# Patient Record
Sex: Female | Born: 1950 | Race: White | Hispanic: Yes | Smoking: Never smoker
Health system: Southern US, Community
[De-identification: ages and names within clinical notes are randomized; demographics above are authoritative.]

## PROBLEM LIST (undated history)

## (undated) DIAGNOSIS — F419 Anxiety disorder, unspecified: Secondary | ICD-10-CM

## (undated) DIAGNOSIS — S069X9A Unspecified intracranial injury with loss of consciousness of unspecified duration, initial encounter: Secondary | ICD-10-CM

## (undated) DIAGNOSIS — G43909 Migraine, unspecified, not intractable, without status migrainosus: Secondary | ICD-10-CM

## (undated) DIAGNOSIS — F4310 Post-traumatic stress disorder, unspecified: Secondary | ICD-10-CM

## (undated) DIAGNOSIS — S069XAA Unspecified intracranial injury with loss of consciousness status unknown, initial encounter: Secondary | ICD-10-CM

## (undated) DIAGNOSIS — E079 Disorder of thyroid, unspecified: Secondary | ICD-10-CM

## (undated) DIAGNOSIS — I10 Essential (primary) hypertension: Secondary | ICD-10-CM

## (undated) HISTORY — PX: MANDIBLE SURGERY: SHX707

## (undated) HISTORY — PX: NASAL SINUS SURGERY: SHX719

## (undated) HISTORY — PX: CHOLECYSTECTOMY: SHX55

## (undated) HISTORY — PX: ABDOMINAL HYSTERECTOMY: SHX81

---

## 2010-11-13 ENCOUNTER — Other Ambulatory Visit (HOSPITAL_COMMUNITY): Payer: Self-pay | Admitting: Chiropractic Medicine

## 2010-11-13 DIAGNOSIS — M5104 Intervertebral disc disorders with myelopathy, thoracic region: Secondary | ICD-10-CM

## 2010-11-17 ENCOUNTER — Other Ambulatory Visit (HOSPITAL_COMMUNITY): Payer: Self-pay | Admitting: Chiropractic Medicine

## 2010-11-17 DIAGNOSIS — R52 Pain, unspecified: Secondary | ICD-10-CM

## 2010-11-18 ENCOUNTER — Other Ambulatory Visit (HOSPITAL_COMMUNITY): Payer: Self-pay

## 2010-11-20 ENCOUNTER — Ambulatory Visit (HOSPITAL_COMMUNITY)
Admission: RE | Admit: 2010-11-20 | Discharge: 2010-11-20 | Disposition: A | Discharge status: Home / Self Care | Payer: Self-pay | Source: Ambulatory Visit | Attending: Chiropractic Medicine | Admitting: Chiropractic Medicine

## 2010-11-20 DIAGNOSIS — R52 Pain, unspecified: Secondary | ICD-10-CM

## 2010-11-20 DIAGNOSIS — Q762 Congenital spondylolisthesis: Secondary | ICD-10-CM | POA: Insufficient documentation

## 2010-11-20 DIAGNOSIS — M47817 Spondylosis without myelopathy or radiculopathy, lumbosacral region: Secondary | ICD-10-CM | POA: Insufficient documentation

## 2010-11-20 DIAGNOSIS — M545 Low back pain, unspecified: Secondary | ICD-10-CM | POA: Insufficient documentation

## 2010-11-20 DIAGNOSIS — M79609 Pain in unspecified limb: Secondary | ICD-10-CM | POA: Insufficient documentation

## 2010-11-20 DIAGNOSIS — M5126 Other intervertebral disc displacement, lumbar region: Secondary | ICD-10-CM | POA: Insufficient documentation

## 2010-11-20 DIAGNOSIS — M5137 Other intervertebral disc degeneration, lumbosacral region: Secondary | ICD-10-CM | POA: Insufficient documentation

## 2010-11-20 DIAGNOSIS — M51379 Other intervertebral disc degeneration, lumbosacral region without mention of lumbar back pain or lower extremity pain: Secondary | ICD-10-CM | POA: Insufficient documentation

## 2010-11-20 DIAGNOSIS — M4120 Other idiopathic scoliosis, site unspecified: Secondary | ICD-10-CM | POA: Insufficient documentation

## 2011-11-09 ENCOUNTER — Other Ambulatory Visit: Payer: Self-pay | Admitting: Neurosurgery

## 2011-11-09 DIAGNOSIS — M5410 Radiculopathy, site unspecified: Secondary | ICD-10-CM

## 2011-11-09 DIAGNOSIS — M479 Spondylosis, unspecified: Secondary | ICD-10-CM

## 2011-11-09 DIAGNOSIS — M549 Dorsalgia, unspecified: Secondary | ICD-10-CM

## 2011-11-16 ENCOUNTER — Other Ambulatory Visit: Payer: Self-pay

## 2011-11-30 ENCOUNTER — Other Ambulatory Visit: Payer: Self-pay | Admitting: Neurosurgery

## 2011-11-30 DIAGNOSIS — M5410 Radiculopathy, site unspecified: Secondary | ICD-10-CM

## 2011-11-30 DIAGNOSIS — M479 Spondylosis, unspecified: Secondary | ICD-10-CM

## 2011-11-30 DIAGNOSIS — M549 Dorsalgia, unspecified: Secondary | ICD-10-CM

## 2011-12-07 ENCOUNTER — Other Ambulatory Visit: Payer: Self-pay

## 2011-12-09 ENCOUNTER — Ambulatory Visit
Admission: RE | Admit: 2011-12-09 | Discharge: 2011-12-09 | Disposition: A | Discharge status: Home / Self Care | Payer: Medicare Other | Source: Ambulatory Visit | Attending: Neurosurgery | Admitting: Neurosurgery

## 2011-12-09 DIAGNOSIS — M479 Spondylosis, unspecified: Secondary | ICD-10-CM

## 2011-12-09 DIAGNOSIS — M5410 Radiculopathy, site unspecified: Secondary | ICD-10-CM

## 2011-12-09 DIAGNOSIS — M549 Dorsalgia, unspecified: Secondary | ICD-10-CM

## 2011-12-09 MED ORDER — IOHEXOL 180 MG/ML  SOLN
15.0000 mL | Freq: Once | INTRAMUSCULAR | Status: AC | PRN
  Administered 2011-12-09: 15 mL via INTRATHECAL

## 2011-12-09 MED ORDER — OXYCODONE-ACETAMINOPHEN 5-325 MG PO TABS
1.0000 | ORAL_TABLET | Freq: Once | ORAL | Status: AC
  Administered 2011-12-09: 1 via ORAL

## 2011-12-09 MED ORDER — DIAZEPAM 5 MG PO TABS
10.0000 mg | ORAL_TABLET | Freq: Once | ORAL | Status: AC
  Administered 2011-12-09: 10 mg via ORAL

## 2011-12-09 NOTE — Progress Notes (Signed)
Patient states she has been off Adderall, Cymbalta, Tramadol and Trazadone for at least the past two days.  jkl

## 2012-02-23 ENCOUNTER — Emergency Department (HOSPITAL_COMMUNITY)
Admission: EM | Admit: 2012-02-23 | Discharge: 2012-02-25 | Disposition: A | Discharge status: Home / Self Care | Payer: Medicare Other | Attending: Emergency Medicine | Admitting: Emergency Medicine

## 2012-02-23 ENCOUNTER — Emergency Department (HOSPITAL_COMMUNITY): Payer: Medicare Other

## 2012-02-23 DIAGNOSIS — Z91199 Patient's noncompliance with other medical treatment and regimen due to unspecified reason: Secondary | ICD-10-CM | POA: Insufficient documentation

## 2012-02-23 DIAGNOSIS — F329 Major depressive disorder, single episode, unspecified: Secondary | ICD-10-CM | POA: Insufficient documentation

## 2012-02-23 DIAGNOSIS — S0990XA Unspecified injury of head, initial encounter: Secondary | ICD-10-CM

## 2012-02-23 DIAGNOSIS — Z79899 Other long term (current) drug therapy: Secondary | ICD-10-CM | POA: Insufficient documentation

## 2012-02-23 DIAGNOSIS — L089 Local infection of the skin and subcutaneous tissue, unspecified: Secondary | ICD-10-CM

## 2012-02-23 DIAGNOSIS — Z9119 Patient's noncompliance with other medical treatment and regimen: Secondary | ICD-10-CM | POA: Insufficient documentation

## 2012-02-23 DIAGNOSIS — IMO0002 Reserved for concepts with insufficient information to code with codable children: Secondary | ICD-10-CM | POA: Insufficient documentation

## 2012-02-23 DIAGNOSIS — S0003XA Contusion of scalp, initial encounter: Secondary | ICD-10-CM | POA: Insufficient documentation

## 2012-02-23 DIAGNOSIS — F411 Generalized anxiety disorder: Secondary | ICD-10-CM | POA: Diagnosis present

## 2012-02-23 DIAGNOSIS — F430 Acute stress reaction: Secondary | ICD-10-CM

## 2012-02-23 DIAGNOSIS — F909 Attention-deficit hyperactivity disorder, unspecified type: Secondary | ICD-10-CM | POA: Insufficient documentation

## 2012-02-23 DIAGNOSIS — F3289 Other specified depressive episodes: Secondary | ICD-10-CM | POA: Insufficient documentation

## 2012-02-23 DIAGNOSIS — Y9389 Activity, other specified: Secondary | ICD-10-CM | POA: Insufficient documentation

## 2012-02-23 DIAGNOSIS — Z792 Long term (current) use of antibiotics: Secondary | ICD-10-CM | POA: Insufficient documentation

## 2012-02-23 DIAGNOSIS — Y9289 Other specified places as the place of occurrence of the external cause: Secondary | ICD-10-CM | POA: Insufficient documentation

## 2012-02-23 DIAGNOSIS — W1809XA Striking against other object with subsequent fall, initial encounter: Secondary | ICD-10-CM | POA: Insufficient documentation

## 2012-02-23 DIAGNOSIS — F29 Unspecified psychosis not due to a substance or known physiological condition: Secondary | ICD-10-CM | POA: Insufficient documentation

## 2012-02-23 DIAGNOSIS — F339 Major depressive disorder, recurrent, unspecified: Secondary | ICD-10-CM

## 2012-02-23 LAB — CBC
HCT: 38.1 % (ref 36.0–46.0)
MCH: 31.3 pg (ref 26.0–34.0)
MCV: 88.4 fL (ref 78.0–100.0)
RBC: 4.31 MIL/uL (ref 3.87–5.11)
WBC: 10.2 10*3/uL (ref 4.0–10.5)

## 2012-02-23 LAB — URINALYSIS, ROUTINE W REFLEX MICROSCOPIC
Glucose, UA: NEGATIVE mg/dL
Hgb urine dipstick: NEGATIVE
Specific Gravity, Urine: 1.015 (ref 1.005–1.030)
Urobilinogen, UA: 0.2 mg/dL (ref 0.0–1.0)
pH: 6 (ref 5.0–8.0)

## 2012-02-23 LAB — RAPID URINE DRUG SCREEN, HOSP PERFORMED
Amphetamines: NOT DETECTED
Benzodiazepines: NOT DETECTED
Cocaine: NOT DETECTED
Opiates: NOT DETECTED

## 2012-02-23 LAB — BASIC METABOLIC PANEL
BUN: 14 mg/dL (ref 6–23)
CO2: 23 mEq/L (ref 19–32)
Chloride: 99 mEq/L (ref 96–112)
Creatinine, Ser: 0.64 mg/dL (ref 0.50–1.10)
Glucose, Bld: 88 mg/dL (ref 70–99)

## 2012-02-23 LAB — ETHANOL: Alcohol, Ethyl (B): <11 mg/dL (ref 0–11)

## 2012-02-23 MED ORDER — IBUPROFEN 600 MG PO TABS: 600.0000 mg | ORAL_TABLET | Freq: Three times a day (TID) | ORAL | Status: DC | PRN

## 2012-02-23 MED ORDER — ONDANSETRON HCL 4 MG PO TABS: 4.0000 mg | ORAL_TABLET | Freq: Three times a day (TID) | ORAL | Status: DC | PRN

## 2012-02-23 MED ORDER — NAPROXEN SODIUM 275 MG PO TABS
275.0000 mg | ORAL_TABLET | Freq: Two times a day (BID) | ORAL | Status: DC | PRN
  Filled 2012-02-23: qty 1

## 2012-02-23 MED ORDER — ALUM & MAG HYDROXIDE-SIMETH 200-200-20 MG/5ML PO SUSP: 30.0000 mL | ORAL | Status: DC | PRN

## 2012-02-23 MED ORDER — ZOLPIDEM TARTRATE 5 MG PO TABS: 5.0000 mg | ORAL_TABLET | Freq: Every evening | ORAL | Status: DC | PRN

## 2012-02-23 MED ORDER — ACETAMINOPHEN 325 MG PO TABS: 650.0000 mg | ORAL_TABLET | ORAL | Status: DC | PRN

## 2012-02-23 MED ORDER — CLONAZEPAM 0.5 MG PO TABS
0.5000 mg | ORAL_TABLET | Freq: Two times a day (BID) | ORAL | Status: DC
  Administered 2012-02-24: 1 mg via ORAL
  Administered 2012-02-24: 0.5 mg via ORAL
  Administered 2012-02-25: 1 mg via ORAL
  Filled 2012-02-23: qty 2
  Filled 2012-02-23: qty 1
  Filled 2012-02-23: qty 2

## 2012-02-23 MED ORDER — NICOTINE 21 MG/24HR TD PT24: 21.0000 mg | MEDICATED_PATCH | Freq: Every day | TRANSDERMAL | Status: DC | PRN

## 2012-02-23 MED ORDER — AMPHETAMINE-DEXTROAMPHET ER 10 MG PO CP24
20.0000 mg | ORAL_CAPSULE | Freq: Two times a day (BID) | ORAL | Status: DC
  Administered 2012-02-24 – 2012-02-25 (×2): 20 mg via ORAL
  Filled 2012-02-23: qty 2
  Filled 2012-02-23: qty 1
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
## 2012-02-23 NOTE — ED Notes (Signed)
JXB:[id number]<GN> Expected date:02/23/12<BR> Expected time: 4:34 PM<BR> Means of arrival:Ambulance<BR> Comments:<BR> Medical clearance

## 2012-02-23 NOTE — ED Notes (Signed)
ACT Team at bedside.  

## 2012-02-23 NOTE — ED Notes (Signed)
Pt has lac and hematoma to back of head on L side of head. Area cleaned with saline and gauze. Pt also has swelling, redness and bruising to L elbow. Ice pack provided to L elbow.

## 2012-02-23 NOTE — ED Notes (Signed)
Pt a/o x 3. Pt denies pain at present. States she doesn't want to be here. Pt makes good eye contact. Thoughts are well formed and organized. [employer] remains outside of pt's room.

## 2012-02-23 NOTE — ED Notes (Signed)
MD at bedside. Dr. Wentz at bedside.  

## 2012-02-23 NOTE — ED Notes (Signed)
Pt's friend came by to check on her. Friend is Margreta Journey [phone].
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
## 2012-02-24 DIAGNOSIS — F339 Major depressive disorder, recurrent, unspecified: Secondary | ICD-10-CM

## 2012-02-24 DIAGNOSIS — F430 Acute stress reaction: Secondary | ICD-10-CM

## 2012-02-24 DIAGNOSIS — F4310 Post-traumatic stress disorder, unspecified: Secondary | ICD-10-CM

## 2012-02-24 MED ORDER — DULOXETINE HCL 60 MG PO CPEP
60.0000 mg | ORAL_CAPSULE | Freq: Every day | ORAL | Status: DC
  Administered 2012-02-24 – 2012-02-25 (×2): 60 mg via ORAL
  Filled 2012-02-24 (×4): qty 1

## 2012-02-24 NOTE — ED Notes (Signed)
Cleaned pt.'s wounds, (1-left elbow, 2-back of head), removed old drsg.'s, cleaned wounds with saline, dried with sterile gauze, applied sterile gauze and taped.  No swelling or oder noted.  Scant amt. Of pale red drainage on head gauze.  Pt. Calm and cooperative.

## 2012-02-24 NOTE — Progress Notes (Addendum)
Pt referred to [facility] pending review, pt declined per act team.   Pt referred to [facility] pending review.   Pt referred to [facility] pending review.   No beds at [facility] or [facility].   Pt declined at [facility].   Catha Gosselin, LCSWA  [phone] .02/24/2012 20:43pm

## 2012-02-24 NOTE — ED Notes (Signed)
Annette Glover [phone]

## 2012-02-24 NOTE — BHH Counselor (Signed)
TC with Claris Che, intake RN at [facility]. Informed they currently do not have bed availability. TC to [place] [facility]. Stated they have adult bed availability. Faxed info over for review.
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
## 2012-02-24 NOTE — ED Notes (Signed)
Patient unable to complete telepysch. Dr. Jacky Kindle reported to call back when patient is more alert.

## 2012-02-24 NOTE — ED Provider Notes (Signed)
Filed Vitals:   02/24/12 0627  BP: 149/86  Pulse: 91  Temp: 98.5 F (36.9 C)  Resp: 18   Pt sleeping in bed. NAD. Appears to be still pending psychiatric consultation.   Raeford Razor, MD 02/24/12 [phone]

## 2012-02-25 DIAGNOSIS — F411 Generalized anxiety disorder: Secondary | ICD-10-CM | POA: Diagnosis present

## 2012-02-25 DIAGNOSIS — F339 Major depressive disorder, recurrent, unspecified: Secondary | ICD-10-CM | POA: Diagnosis present

## 2012-02-25 MED ORDER — DULOXETINE HCL 60 MG PO CPEP: 60.0000 mg | ORAL_CAPSULE | Freq: Every day | ORAL | Status: AC
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
## 2012-02-25 NOTE — ED Provider Notes (Signed)
On AM rounds the patient is awake, alert, appropriately interactive.  She states that she is comfortable, and is hungry.  Psych eval pending  Gerhard Munch, MD 02/25/12 [phone]

## 2012-02-25 NOTE — Progress Notes (Signed)
CSW completed [facility] application, received [facility] authorization [id number] for 02/25/12-03/02/12.  CSW faxed application to [facility].   CSW will f/u re: review/wait list status.    Vickii Penna, LCSWA [phone]  Clinical Social Work

## 2012-02-25 NOTE — [facility] Suicide Risk Assessment (Signed)
Suicide Risk Assessment  Discharge Assessment     Demographic Factors:  Adolescent or young adult, Caucasian and Low socioeconomic status  Mental Status Per Nursing Assessment::   On Admission:     Current Mental Status by Physician: NA  Loss Factors: Financial problems/change in socioeconomic status and family stresses  Historical Factors: NA  Risk Reduction Factors:   Sense of responsibility to family, Religious beliefs about death, Living with another person, especially a relative, Positive social support and Positive therapeutic relationship  Continued Clinical Symptoms:  Severe Anxiety and/or Agitation Depression:   Anhedonia Hopelessness Impulsivity Insomnia Recent sense of peace/wellbeing Severe Previous Psychiatric Diagnoses and Treatments Medical Diagnoses and Treatments/Surgeries  Cognitive Features That Contribute To Risk:  Closed-mindedness Loss of executive function Polarized thinking    Suicide Risk:  Minimal: No identifiable suicidal ideation.  Patients presenting with no risk factors but with morbid ruminations; may be classified as minimal risk based on the severity of the depressive symptoms  Discharge Diagnoses:   AXIS I:  Mood Disorder NOS, Post Traumatic Stress Disorder and Acute stress reaction AXIS II:  Deferred AXIS III:  No past medical history on file. AXIS IV:  other psychosocial or environmental problems, problems related to social environment and problems with primary support group AXIS V:  41-50 serious symptoms  Plan Of Care/Follow-up recommendations:  Activity:  as tolerated Diet:  regular  Is patient on multiple antipsychotic therapies at discharge:  No   Has Patient had three or more failed trials of antipsychotic monotherapy by history:  No  Recommended Plan for Multiple Antipsychotic Therapies: Not applicable  Annette Glover,Annette R. 02/25/2012, 2:54 PM

## 2012-08-10 ENCOUNTER — Emergency Department (HOSPITAL_BASED_OUTPATIENT_CLINIC_OR_DEPARTMENT_OTHER): Payer: Medicare Other

## 2012-08-10 ENCOUNTER — Emergency Department (HOSPITAL_BASED_OUTPATIENT_CLINIC_OR_DEPARTMENT_OTHER)
Admission: EM | Admit: 2012-08-10 | Discharge: 2012-08-10 | Disposition: A | Discharge status: Home / Self Care | Payer: Medicare Other | Attending: Emergency Medicine | Admitting: Emergency Medicine

## 2012-08-10 ENCOUNTER — Encounter (HOSPITAL_BASED_OUTPATIENT_CLINIC_OR_DEPARTMENT_OTHER): Payer: Self-pay | Admitting: *Deleted

## 2012-08-10 DIAGNOSIS — Z79899 Other long term (current) drug therapy: Secondary | ICD-10-CM | POA: Insufficient documentation

## 2012-08-10 DIAGNOSIS — S6990XA Unspecified injury of unspecified wrist, hand and finger(s), initial encounter: Secondary | ICD-10-CM | POA: Insufficient documentation

## 2012-08-10 DIAGNOSIS — W1809XA Striking against other object with subsequent fall, initial encounter: Secondary | ICD-10-CM | POA: Insufficient documentation

## 2012-08-10 DIAGNOSIS — Y92009 Unspecified place in unspecified non-institutional (private) residence as the place of occurrence of the external cause: Secondary | ICD-10-CM | POA: Insufficient documentation

## 2012-08-10 DIAGNOSIS — S42301A Unspecified fracture of shaft of humerus, right arm, initial encounter for closed fracture: Secondary | ICD-10-CM

## 2012-08-10 DIAGNOSIS — S42309A Unspecified fracture of shaft of humerus, unspecified arm, initial encounter for closed fracture: Secondary | ICD-10-CM | POA: Insufficient documentation

## 2012-08-10 DIAGNOSIS — I10 Essential (primary) hypertension: Secondary | ICD-10-CM | POA: Insufficient documentation

## 2012-08-10 DIAGNOSIS — S0993XA Unspecified injury of face, initial encounter: Secondary | ICD-10-CM | POA: Insufficient documentation

## 2012-08-10 DIAGNOSIS — Z880 Allergy status to penicillin: Secondary | ICD-10-CM | POA: Insufficient documentation

## 2012-08-10 DIAGNOSIS — E079 Disorder of thyroid, unspecified: Secondary | ICD-10-CM | POA: Insufficient documentation

## 2012-08-10 DIAGNOSIS — Y9389 Activity, other specified: Secondary | ICD-10-CM | POA: Insufficient documentation

## 2012-08-10 HISTORY — DX: Unspecified intracranial injury with loss of consciousness of unspecified duration, initial encounter: S06.9X9A

## 2012-08-10 HISTORY — DX: Essential (primary) hypertension: I10

## 2012-08-10 HISTORY — DX: Disorder of thyroid, unspecified: E07.9

## 2012-08-10 HISTORY — DX: Unspecified intracranial injury with loss of consciousness status unknown, initial encounter: S06.9XAA

## 2012-08-10 MED ORDER — OXYCODONE-ACETAMINOPHEN 5-325 MG PO TABS: 2.0000 | ORAL_TABLET | ORAL | Status: AC | PRN

## 2012-08-10 MED ORDER — HYDROCODONE-ACETAMINOPHEN 5-325 MG PO TABS
2.0000 | ORAL_TABLET | Freq: Once | ORAL | Status: AC
  Administered 2012-08-10: 2 via ORAL
  Filled 2012-08-10 (×2): qty 2

## 2012-08-10 NOTE — ED Notes (Signed)
Pt going by cab on voucher to friend's house for the night

## 2012-08-10 NOTE — ED Notes (Signed)
Patient transported to X-ray and returned 

## 2012-08-10 NOTE — ED Notes (Signed)
Pt attempting to reach a friend to see if she can stay with her tonight. States that it is not an option for her to stay with her daughter

## 2012-08-10 NOTE — ED Notes (Signed)
D/c on hold at this time per EDP Gwendolyn Grant

## 2012-08-10 NOTE — ED Notes (Signed)
Patient transported to X-ray 

## 2012-08-10 NOTE — ED Provider Notes (Signed)
CSN: [id number]     Arrival date & time 08/10/12  1839 History     First MD Initiated Contact with Patient 08/10/12 1856     Chief Complaint  Patient presents with  . Fall   (Consider location/radiation/quality/duration/timing/severity/associated sxs/prior Treatment) Patient is a 62 y.o. female presenting with fall. The history is provided by the patient. No language interpreter was used.  Fall This is a new problem. The current episode started today. The problem occurs constantly. The problem has been gradually worsening. Associated symptoms include joint swelling and neck pain. Nothing aggravates the symptoms. She has tried nothing for the symptoms. The treatment provided moderate relief.  Pt slipped over a flattened card board box and fell hitting her right shoulder.  Pt complains of swelling and pain to shoulder,  Right hand and to the back of her neck  Past Medical History  Diagnosis Date  . Hypertension   . Thyroid disease   . Traumatic brain injury    History reviewed. No pertinent past surgical history. No family history on file. History  Substance Use Topics  . Smoking status: Never Smoker   . Smokeless tobacco: Not on file  . Alcohol Use: No   OB History   Grav Para Term Preterm Abortions TAB SAB Ect Mult Living                 Review of Systems  HENT: Positive for neck pain.   Musculoskeletal: Positive for joint swelling.  All other systems reviewed and are negative.    Allergies  Sulfa antibiotics; Contrast media; Gadolinium derivatives; Keflex; Penicillins; and Phenergan  Home Medications   Current Outpatient Rx  Name  Route  Sig  Dispense  Refill  . HYDROcodone-acetaminophen (NORCO/VICODIN) 5-325 MG per tablet   Oral   Take 1 tablet by mouth every 6 (six) hours as needed for pain.         Marland Kitchen levothyroxine (SYNTHROID, LEVOTHROID) 100 MCG tablet   Oral   Take 100 mcg by mouth daily before breakfast.         . lisinopril (PRINIVIL,ZESTRIL) 20 MG  tablet   Oral   Take 20 mg by mouth daily.         . traMADol (ULTRAM) 50 MG tablet   Oral   Take 50 mg by mouth every 6 (six) hours as needed for pain.         Marland Kitchen amphetamine-dextroamphetamine (ADDERALL XR) 10 MG 24 hr capsule   Oral   Take 20-30 mg by mouth 2 (two) times daily. 3 caps in am; 2 caps in pm         . clarithromycin (BIAXIN XL) 500 MG 24 hr tablet   Oral   Take 1,000 mg by mouth daily. 10 day course of therapy         . clonazePAM (KLONOPIN) 0.5 MG tablet   Oral   Take 0.5-1 mg by mouth 2 (two) times daily as needed for anxiety.         . DULoxetine (CYMBALTA) 60 MG capsule   Oral   Take 1 capsule (60 mg total) by mouth daily.   10 capsule   0   . naproxen sodium (ANAPROX) 220 MG tablet   Oral   Take 220 mg by mouth 2 (two) times daily as needed (for pain.).          BP 132/83  Temp(Src) 97.8 F (36.6 C) (Oral)  Resp 20  SpO2 97% Physical Exam  Nursing note and vitals reviewed. Constitutional: She is oriented to person, place, and time. She appears well-developed and well-nourished.  HENT:  Head: Normocephalic and atraumatic.  Right Ear: External ear normal.  Left Ear: External ear normal.  Nose: Nose normal.  Eyes: Conjunctivae and EOM are normal. Pupils are equal, round, and reactive to light.  Neck: Normal range of motion.  Cardiovascular: Normal rate and normal heart sounds.   Pulmonary/Chest: Effort normal and breath sounds normal.  Abdominal: Soft. She exhibits no distension.  Musculoskeletal: Normal range of motion.  Neurological: She is alert and oriented to person, place, and time.  Skin: Skin is warm.  Psychiatric: She has a normal mood and affect.    ED Course   Procedures (including critical care time)  Labs Reviewed - No data to display Dg Chest 2 View  08/10/2012   *RADIOLOGY REPORT*  Clinical Data: Recent traumatic injury with right-sided chest pain  CHEST - 2 VIEW  Comparison: None.  Findings: The heart and  pulmonary vascularity are within normal limits.  The lungs are clear bilaterally.  No acute bony abnormality is seen.  IMPRESSION: No acute abnormality is noted.   Original Report Authenticated By: Alcide Clever, M.D.   Dg Cervical Spine Complete  08/10/2012   *RADIOLOGY REPORT*  Clinical Data: Recent traumatic injury with right-sided neck pain  CERVICAL SPINE - COMPLETE 4+ VIEW  Comparison: None.  Findings: Seven cervical segments are well visualized.  Vertebral body height is well-maintained.  Disc space narrowing is noted from C4-C7.  The odontoid is within normal limits.  Multilevel facet hypertrophic changes are seen.  Mild neural foraminal changes are noted at C5-6 bilaterally.  No soft tissue changes are noted. Postsurgical changes in the mandible are noted bilaterally.  IMPRESSION: Chronic changes without acute abnormality.   Original Report Authenticated By: Alcide Clever, M.D.   Dg Shoulder Right  08/10/2012   *RADIOLOGY REPORT*  Clinical Data: Traumatic injury with right shoulder pain  RIGHT SHOULDER - 2+ VIEW  Comparison: None.  Findings: There is a comminuted fracture which appears to involve the anatomic neck with some impaction at the fracture site.  No dislocation is noted.  Degenerative changes of the acromioclavicular joint is noted.  IMPRESSION: Fracture of the proximal humerus without dislocation.   Original Report Authenticated By: Alcide Clever, M.D.   Dg Hand Complete Right  08/10/2012   *RADIOLOGY REPORT*  Clinical Data:  Traumatic injury with right hand pain  RIGHT HAND - COMPLETE 3+ VIEW  Comparison: None.  Findings: No acute fracture or dislocation is noted.  Diffuse degenerative changes are noted throughout the interphalangeal joints as well as at the first carpometacarpal articulation.  IMPRESSION: Degenerative change without acute abnormality.   Original Report Authenticated By: Alcide Clever, M.D.   1. Fracture, humerus, right, closed, initial encounter     MDM  Pt advised to  follow up with Orthopaedist for evaluation.   Pt given rx for percocet.    Lonia Skinner [place], PA-C 08/10/12 2254

## 2012-08-10 NOTE — ED Notes (Addendum)
Pt's daughter has gone home per pt report. Pt states that she will need to have a cab called for transport home. Pt states that she does not have any friends or other family members to pick her up. Does not want pain meds at this time.

## 2012-08-10 NOTE — ED Notes (Signed)
c-collar removed per vorb from EDPA Langston Masker

## 2012-08-10 NOTE — ED Notes (Signed)
[facility] reports patient fell in her home and landed on her right shoulder.  LSB & C-colar.  States she was on ground for a long time, no loc.  VSS.  No deformity.

## 2012-08-10 NOTE — ED Notes (Signed)
Patient states that she tripped and  fell at home and is now c/o right shoulder pain

## 2012-08-10 NOTE — ED Provider Notes (Signed)
Medical screening examination/treatment/procedure(s) were performed by non-physician practitioner and as supervising physician I was immediately available for consultation/collaboration.   William Karen Huhta, MD 08/11/12 0000 

## 2012-08-11 NOTE — ED Notes (Signed)
Pt called sts she did not get home with DC instructions nor her prescription. Pt given name and phone number of Dr Ophelia Charter to F/U with.

## 2013-01-16 ENCOUNTER — Emergency Department (HOSPITAL_BASED_OUTPATIENT_CLINIC_OR_DEPARTMENT_OTHER)
Admission: EM | Admit: 2013-01-16 | Discharge: 2013-01-16 | Disposition: A | Discharge status: Home / Self Care | Payer: Medicare Other | Attending: Emergency Medicine | Admitting: Emergency Medicine

## 2013-01-16 ENCOUNTER — Emergency Department (HOSPITAL_BASED_OUTPATIENT_CLINIC_OR_DEPARTMENT_OTHER): Payer: Medicare Other

## 2013-01-16 ENCOUNTER — Encounter (HOSPITAL_BASED_OUTPATIENT_CLINIC_OR_DEPARTMENT_OTHER): Payer: Self-pay | Admitting: Emergency Medicine

## 2013-01-16 DIAGNOSIS — Z79899 Other long term (current) drug therapy: Secondary | ICD-10-CM | POA: Insufficient documentation

## 2013-01-16 DIAGNOSIS — I10 Essential (primary) hypertension: Secondary | ICD-10-CM | POA: Insufficient documentation

## 2013-01-16 DIAGNOSIS — Z792 Long term (current) use of antibiotics: Secondary | ICD-10-CM | POA: Insufficient documentation

## 2013-01-16 DIAGNOSIS — E079 Disorder of thyroid, unspecified: Secondary | ICD-10-CM | POA: Insufficient documentation

## 2013-01-16 DIAGNOSIS — N390 Urinary tract infection, site not specified: Secondary | ICD-10-CM | POA: Insufficient documentation

## 2013-01-16 DIAGNOSIS — Z87820 Personal history of traumatic brain injury: Secondary | ICD-10-CM | POA: Insufficient documentation

## 2013-01-16 DIAGNOSIS — Z880 Allergy status to penicillin: Secondary | ICD-10-CM | POA: Insufficient documentation

## 2013-01-16 HISTORY — DX: Unspecified intracranial injury with loss of consciousness status unknown, initial encounter: S06.9XAA

## 2013-01-16 HISTORY — DX: Unspecified intracranial injury with loss of consciousness of unspecified duration, initial encounter: S06.9X9A

## 2013-01-16 LAB — URINALYSIS, ROUTINE W REFLEX MICROSCOPIC
BILIRUBIN URINE: NEGATIVE
GLUCOSE, UA: NEGATIVE mg/dL
NITRITE: POSITIVE — AB
PH: 8.5 — AB (ref 5.0–8.0)
Protein, ur: 30 mg/dL — AB
Specific Gravity, Urine: 1.017 (ref 1.005–1.030)
Urobilinogen, UA: 1 mg/dL (ref 0.0–1.0)

## 2013-01-16 LAB — CBC WITH DIFFERENTIAL/PLATELET
BASOS ABS: 0 10*3/uL (ref 0.0–0.1)
BASOS PCT: 0 % (ref 0–1)
Eosinophils Absolute: 0 10*3/uL (ref 0.0–0.7)
Eosinophils Relative: 0 % (ref 0–5)
HCT: 36.3 % (ref 36.0–46.0)
Hemoglobin: 12.4 g/dL (ref 12.0–15.0)
Lymphocytes Relative: 9 % — ABNORMAL LOW (ref 12–46)
Lymphs Abs: 1.1 10*3/uL (ref 0.7–4.0)
MCH: 28.9 pg (ref 26.0–34.0)
MCHC: 34.2 g/dL (ref 30.0–36.0)
MCV: 84.6 fL (ref 78.0–100.0)
Monocytes Absolute: 1.1 10*3/uL — ABNORMAL HIGH (ref 0.1–1.0)
Monocytes Relative: 9 % (ref 3–12)
NEUTROS ABS: 10.2 10*3/uL — AB (ref 1.7–7.7)
NEUTROS PCT: 81 % — AB (ref 43–77)
Platelets: 335 10*3/uL (ref 150–400)
RBC: 4.29 MIL/uL (ref 3.87–5.11)
RDW: 13.5 % (ref 11.5–15.5)
WBC: 12.6 10*3/uL — ABNORMAL HIGH (ref 4.0–10.5)

## 2013-01-16 LAB — BASIC METABOLIC PANEL
BUN: 9 mg/dL (ref 6–23)
CHLORIDE: 94 meq/L — AB (ref 96–112)
CO2: 20 mEq/L (ref 19–32)
Calcium: 9.9 mg/dL (ref 8.4–10.5)
Creatinine, Ser: 0.6 mg/dL (ref 0.50–1.10)
GFR calc non Af Amer: >90 mL/min (ref >90)
Glucose, Bld: 108 mg/dL — ABNORMAL HIGH (ref 70–99)
POTASSIUM: 4 meq/L (ref 3.7–5.3)
Sodium: 131 mEq/L — ABNORMAL LOW (ref 137–147)

## 2013-01-16 LAB — URINE MICROSCOPIC-ADD ON

## 2013-01-16 MED ORDER — LEVOFLOXACIN 750 MG PO TABS: 750.0000 mg | ORAL_TABLET | Freq: Every day | ORAL | Status: AC

## 2013-01-16 MED ORDER — ONDANSETRON 4 MG PO TBDP
4.0000 mg | ORAL_TABLET | Freq: Once | ORAL | Status: AC
  Administered 2013-01-16: 4 mg via ORAL
  Filled 2013-01-16: qty 1

## 2013-01-16 MED ORDER — ACETAMINOPHEN 325 MG PO TABS
650.0000 mg | ORAL_TABLET | Freq: Once | ORAL | Status: AC
  Administered 2013-01-16: 650 mg via ORAL
  Filled 2013-01-16: qty 2

## 2013-01-16 MED ORDER — LEVOFLOXACIN IN D5W 750 MG/150ML IV SOLN
750.0000 mg | Freq: Once | INTRAVENOUS | Status: AC
  Administered 2013-01-16: 750 mg via INTRAVENOUS
  Filled 2013-01-16: qty 150

## 2013-01-16 NOTE — ED Notes (Signed)
Pt brought via [facility] for anxiety and uti symptoms. EMS sts pt had a PCP appt at 4pm today and when CNA arrived to her home pt told CNA she needed to be transported via EMS. EMS sts pt asked them to transport to PCP office and when told they could not do that Pt then requested to come to [facility].

## 2013-01-16 NOTE — ED Provider Notes (Signed)
CSN: [id number]     Arrival date & time 01/16/13  1645 History  This chart was scribed for Audree Camel, MD by Greggory Stallion, ED Scribe. This patient was seen in room MH04/MH04 and the patient's care was started at 5:14 PM.  Chief Complaint  Patient presents with  . Urinary Tract Infection   The history is provided by the patient. No language interpreter was used.   HPI Comments: Tiyana Galla is a 63 y.o. female who presents to the Emergency Department complaining of urinary frequency that started about one week ago and dysuria that started 3 days ago. Pt has taken Vitamin C packets and lemon packets with relief. She started having hematuria and right, mid back pain that started this morning. pt had nausea earlier but it is relieved now. Denies fever, emesis, vaginal bleeding, rectal bleeding. Pt has never needed a catheter.   Past Medical History  Diagnosis Date  . Hypertension   . Thyroid disease   . Traumatic brain injury   . Brain injury    Past Surgical History  Procedure Laterality Date  . Mandible surgery    . Abdominal hysterectomy    . Nasal sinus surgery    . Cholecystectomy     No family history on file. History  Substance Use Topics  . Smoking status: Never Smoker   . Smokeless tobacco: Not on file  . Alcohol Use: No   OB History   Grav Para Term Preterm Abortions TAB SAB Ect Mult Living                 Review of Systems  Constitutional: Negative for fever.  Gastrointestinal: Negative for nausea, vomiting and anal bleeding.  Genitourinary: Positive for dysuria, frequency and hematuria. Negative for vaginal bleeding.  All other systems reviewed and are negative.    Allergies  Sulfa antibiotics; Contrast media; Gadolinium derivatives; Keflex; Penicillins; and Phenergan  Home Medications   Current Outpatient Rx  Name  Route  Sig  Dispense  Refill  . amphetamine-dextroamphetamine (ADDERALL XR) 10 MG 24 hr capsule   Oral   Take 20-30 mg by mouth 2 (two)  times daily. 3 caps in am; 2 caps in pm         . clarithromycin (BIAXIN XL) 500 MG 24 hr tablet   Oral   Take 1,000 mg by mouth daily. 10 day course of therapy         . clonazePAM (KLONOPIN) 0.5 MG tablet   Oral   Take 0.5-1 mg by mouth 2 (two) times daily as needed for anxiety.         . DULoxetine (CYMBALTA) 60 MG capsule   Oral   Take 1 capsule (60 mg total) by mouth daily.   10 capsule   0   . HYDROcodone-acetaminophen (NORCO/VICODIN) 5-325 MG per tablet   Oral   Take 1 tablet by mouth every 6 (six) hours as needed for pain.         Marland Kitchen levothyroxine (SYNTHROID, LEVOTHROID) 100 MCG tablet   Oral   Take 100 mcg by mouth daily before breakfast.         . lisinopril (PRINIVIL,ZESTRIL) 20 MG tablet   Oral   Take 20 mg by mouth daily.         . naproxen sodium (ANAPROX) 220 MG tablet   Oral   Take 220 mg by mouth 2 (two) times daily as needed (for pain.).         Marland Kitchen  oxyCODONE-acetaminophen (PERCOCET/ROXICET) 5-325 MG per tablet   Oral   Take 2 tablets by mouth every 4 (four) hours as needed for pain.   20 tablet   0   . traMADol (ULTRAM) 50 MG tablet   Oral   Take 50 mg by mouth every 6 (six) hours as needed for pain.          BP 136/84  Pulse 98  Temp(Src) 98.6 F (37 C) (Oral)  Resp 18  Ht 5\' 2"  (1.575 m)  Wt 150 lb (68.04 kg)  BMI 27.43 kg/m2  SpO2 100%  Physical Exam  Nursing note and vitals reviewed. Constitutional: She is oriented to person, place, and time. She appears well-developed and well-nourished. No distress.  HENT:  Head: Normocephalic and atraumatic.  Eyes: EOM are normal.  Neck: Neck supple. No tracheal deviation present.  Cardiovascular: Normal rate, regular rhythm and normal heart sounds.   No murmur heard. Pulmonary/Chest: Effort normal and breath sounds normal. No respiratory distress. She has no wheezes. She has no rales.  Abdominal: Soft. There is no tenderness. There is CVA tenderness.  Mild right CVA tenderness.    Musculoskeletal: Normal range of motion.  Neurological: She is alert and oriented to person, place, and time.  Skin: Skin is warm and dry.  Psychiatric: She has a normal mood and affect. Her behavior is normal.    ED Course  Procedures (including critical care time)  DIAGNOSTIC STUDIES: Oxygen Saturation is 100% on RA, normal by my interpretation.    COORDINATION OF CARE: 5:25 PM-Discussed treatment plan which includes CT scan, UA and blood work with pt at bedside and pt agreed to plan.   Labs Review Labs Reviewed  URINALYSIS, ROUTINE W REFLEX MICROSCOPIC - Abnormal; Notable for the following:    APPearance CLOUDY (*)    pH 8.5 (*)    Hgb urine dipstick MODERATE (*)    Ketones, ur >80 (*)    Protein, ur 30 (*)    Nitrite POSITIVE (*)    Leukocytes, UA LARGE (*)    All other components within normal limits  CBC WITH DIFFERENTIAL - Abnormal; Notable for the following:    WBC 12.6 (*)    Neutrophils Relative % 81 (*)    Neutro Abs 10.2 (*)    Lymphocytes Relative 9 (*)    Monocytes Absolute 1.1 (*)    All other components within normal limits  BASIC METABOLIC PANEL - Abnormal; Notable for the following:    Sodium 131 (*)    Chloride 94 (*)    Glucose, Bld 108 (*)    All other components within normal limits  URINE MICROSCOPIC-ADD ON - Abnormal; Notable for the following:    Bacteria, UA MANY (*)    All other components within normal limits  URINE CULTURE   Imaging Review Ct Abdomen Pelvis Wo Contrast  01/16/2013   CLINICAL DATA:  Right flank pain.  Hematuria.  EXAM: CT ABDOMEN AND PELVIS WITHOUT CONTRAST  TECHNIQUE: Multidetector CT imaging of the abdomen and pelvis was performed following the standard protocol without intravenous contrast.  COMPARISON:  None.  FINDINGS: Lung Bases: Normal.  Liver: Fatty liver. Unenhanced CT was performed per clinician order. Lack of IV contrast limits sensitivity and specificity, especially for evaluation of abdominal/pelvic solid  viscera.  Spleen:  Normal.  Gallbladder:  Cholecystectomy.  Common bile duct:  Normal.  Pancreas:  Normal.  Adrenal glands:  Normal bilaterally.  Kidneys: Normal. No hydronephrosis or calculi. Both ureters appear within normal  limits.  Stomach:  Grossly normal.  Small bowel:  Grossly normal.  Colon: Normal appendix. High position of the cecum. Normal appearance of the colon.  Pelvic Genitourinary: Hysterectomy. Urinary bladder appears normal. No free fluid.  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 Lumbar spondylosis with degenerative spondylolisthesis. Lumbar scoliosis.  When comparing today's appearance of the right hip to the prior exam of 10/13/2012, there has been marked interval progression. Destructive changes of the right femoral head are now severe, with collapse of the head and remodeling of the acetabulum. Debris and loose bodies are present within the joint. Atrophy of the right gluteus minimis is noted which may be secondary to disuse.  Vasculature: Atherosclerosis without acute abnormality.  Body Wall: Normal.  IMPRESSION: 1. No acute intra-abdominal abnormality. 2. Fatty liver. 3. Cholecystectomy and hysterectomy. 4. Severe progressive destructive changes of the right hip. Although this may represent rapidly progressive AVN and secondary osteoarthritis, the appearance can also be associated with neuropathic joint or chronic septic arthritis.   Electronically Signed   By: Andreas Newport M.D.   On: 01/16/2013 18:13    EKG Interpretation   None       MDM   1. UTI (urinary tract infection)    Patient has benign abd exam and mild right sided tenderness. No new motor weakness or numbness. No midline back pain. Urine positive for UTI. No signs of stone or perinephric stranding. Has had no fevers or trouble taking PO. As her vitals are benign, will treat as possible early pyelo with IV abx here and po at home. Given her multiple allergies, will treat with Levaquin. patient  understands return precautions.  I personally performed the services described in this documentation, which was scribed in my presence. The recorded information has been reviewed and is accurate.   Audree Camel, MD 01/17/13 [phone]

## 2013-01-16 NOTE — ED Notes (Signed)
C/o "symptoms of a UTI" x 3 days-dysuria and frequency

## 2013-01-18 ENCOUNTER — Emergency Department (HOSPITAL_BASED_OUTPATIENT_CLINIC_OR_DEPARTMENT_OTHER): Payer: Medicare Other

## 2013-01-18 ENCOUNTER — Emergency Department (HOSPITAL_BASED_OUTPATIENT_CLINIC_OR_DEPARTMENT_OTHER)
Admission: EM | Admit: 2013-01-18 | Discharge: 2013-01-18 | Disposition: A | Discharge status: Other Acute Inpatient Hospital | Payer: Medicare Other | Attending: Emergency Medicine | Admitting: Emergency Medicine

## 2013-01-18 ENCOUNTER — Encounter (HOSPITAL_BASED_OUTPATIENT_CLINIC_OR_DEPARTMENT_OTHER): Payer: Self-pay | Admitting: Emergency Medicine

## 2013-01-18 DIAGNOSIS — R531 Weakness: Secondary | ICD-10-CM

## 2013-01-18 DIAGNOSIS — R630 Anorexia: Secondary | ICD-10-CM | POA: Insufficient documentation

## 2013-01-18 DIAGNOSIS — Z87820 Personal history of traumatic brain injury: Secondary | ICD-10-CM | POA: Insufficient documentation

## 2013-01-18 DIAGNOSIS — Z880 Allergy status to penicillin: Secondary | ICD-10-CM | POA: Insufficient documentation

## 2013-01-18 DIAGNOSIS — E860 Dehydration: Secondary | ICD-10-CM | POA: Insufficient documentation

## 2013-01-18 DIAGNOSIS — Z792 Long term (current) use of antibiotics: Secondary | ICD-10-CM | POA: Insufficient documentation

## 2013-01-18 DIAGNOSIS — I10 Essential (primary) hypertension: Secondary | ICD-10-CM | POA: Insufficient documentation

## 2013-01-18 DIAGNOSIS — Z79899 Other long term (current) drug therapy: Secondary | ICD-10-CM | POA: Insufficient documentation

## 2013-01-18 DIAGNOSIS — M6281 Muscle weakness (generalized): Secondary | ICD-10-CM | POA: Insufficient documentation

## 2013-01-18 DIAGNOSIS — R110 Nausea: Secondary | ICD-10-CM | POA: Insufficient documentation

## 2013-01-18 DIAGNOSIS — M25559 Pain in unspecified hip: Secondary | ICD-10-CM | POA: Insufficient documentation

## 2013-01-18 DIAGNOSIS — E079 Disorder of thyroid, unspecified: Secondary | ICD-10-CM | POA: Insufficient documentation

## 2013-01-18 DIAGNOSIS — M25551 Pain in right hip: Secondary | ICD-10-CM

## 2013-01-18 DIAGNOSIS — N390 Urinary tract infection, site not specified: Secondary | ICD-10-CM | POA: Insufficient documentation

## 2013-01-18 LAB — URINALYSIS, ROUTINE W REFLEX MICROSCOPIC
Bilirubin Urine: NEGATIVE
GLUCOSE, UA: NEGATIVE mg/dL
Ketones, ur: 40 mg/dL — AB
NITRITE: NEGATIVE
PH: 6 (ref 5.0–8.0)
Protein, ur: NEGATIVE mg/dL
SPECIFIC GRAVITY, URINE: 1.008 (ref 1.005–1.030)
Urobilinogen, UA: 0.2 mg/dL (ref 0.0–1.0)

## 2013-01-18 LAB — COMPREHENSIVE METABOLIC PANEL
ALBUMIN: 4.2 g/dL (ref 3.5–5.2)
ALK PHOS: 157 U/L — AB (ref 39–117)
ALT: 36 U/L — ABNORMAL HIGH (ref 0–35)
AST: 20 U/L (ref 0–37)
BUN: 11 mg/dL (ref 6–23)
CALCIUM: 10.2 mg/dL (ref 8.4–10.5)
CO2: 21 mEq/L (ref 19–32)
CREATININE: 0.5 mg/dL (ref 0.50–1.10)
Chloride: 95 mEq/L — ABNORMAL LOW (ref 96–112)
GFR calc Af Amer: >90 mL/min (ref >90)
GFR calc non Af Amer: >90 mL/min (ref >90)
Glucose, Bld: 101 mg/dL — ABNORMAL HIGH (ref 70–99)
POTASSIUM: 3.8 meq/L (ref 3.7–5.3)
Sodium: 132 mEq/L — ABNORMAL LOW (ref 137–147)
TOTAL PROTEIN: 7.5 g/dL (ref 6.0–8.3)
Total Bilirubin: 0.6 mg/dL (ref 0.3–1.2)

## 2013-01-18 LAB — URINE CULTURE: Colony Count: >=100000

## 2013-01-18 LAB — CBC WITH DIFFERENTIAL/PLATELET
BASOS PCT: 0 % (ref 0–1)
Basophils Absolute: 0 10*3/uL (ref 0.0–0.1)
EOS ABS: 0.1 10*3/uL (ref 0.0–0.7)
EOS PCT: 1 % (ref 0–5)
HCT: 37.3 % (ref 36.0–46.0)
HEMOGLOBIN: 13 g/dL (ref 12.0–15.0)
Lymphocytes Relative: 15 % (ref 12–46)
Lymphs Abs: 1.1 10*3/uL (ref 0.7–4.0)
MCH: 29.3 pg (ref 26.0–34.0)
MCHC: 34.9 g/dL (ref 30.0–36.0)
MCV: 84.2 fL (ref 78.0–100.0)
MONOS PCT: 11 % (ref 3–12)
Monocytes Absolute: 0.8 10*3/uL (ref 0.1–1.0)
NEUTROS PCT: 74 % (ref 43–77)
Neutro Abs: 5.4 10*3/uL (ref 1.7–7.7)
PLATELETS: 342 10*3/uL (ref 150–400)
RBC: 4.43 MIL/uL (ref 3.87–5.11)
RDW: 13.5 % (ref 11.5–15.5)
WBC: 7.3 10*3/uL (ref 4.0–10.5)

## 2013-01-18 LAB — URINE MICROSCOPIC-ADD ON

## 2013-01-18 LAB — TROPONIN I

## 2013-01-18 MED ORDER — LORAZEPAM 2 MG/ML IJ SOLN
1.0000 mg | Freq: Once | INTRAMUSCULAR | Status: AC
  Administered 2013-01-18: 1 mg via INTRAVENOUS
  Filled 2013-01-18: qty 1

## 2013-01-18 MED ORDER — SODIUM CHLORIDE 0.9 % IV BOLUS (SEPSIS)
1000.0000 mL | Freq: Once | INTRAVENOUS | Status: AC
  Administered 2013-01-18: 1000 mL via INTRAVENOUS

## 2013-01-18 NOTE — ED Notes (Signed)
Daughter contacted per pt request

## 2013-01-18 NOTE — ED Notes (Signed)
Report called to unit RN. °

## 2013-01-18 NOTE — ED Notes (Signed)
Pt informed of plan of care and admission.  Awaiting [facility] for transport.

## 2013-01-18 NOTE — ED Provider Notes (Signed)
CSN: [id number]     Arrival date & time 01/18/13  1237 History   First MD Initiated Contact with Patient 01/18/13 1253     Chief Complaint  Patient presents with  . Hip Pain   (Consider location/radiation/quality/duration/timing/severity/associated sxs/prior Treatment) Patient is a 63 y.o. female presenting with hip pain. The history is provided by the patient. No language interpreter was used.  Hip Pain This is a new problem. The current episode started today. The problem occurs constantly. The problem has been gradually worsening. Associated symptoms include weakness. Nothing aggravates the symptoms. She has tried nothing for the symptoms. The treatment provided no relief.  Pt reports she was suppose to go to Orthopaedist today  But home health person could not get her to appointment.  Pt reports she can not walk or transfer now.  Pt reports she does not feel she is getting the care she needs at home.  Pt reports she recently had a uti.  Pt complains of nausea and decreased appetite.  Pt sees Dr. Coralee Pesa at [facility].  Pt was seen here 2 days ago.  Pt reports feeling worse since then.  (Pt has a history of head injury and psychiatric illness, history is rambling) Pt lives alone  Past Medical History  Diagnosis Date  . Hypertension   . Thyroid disease   . Traumatic brain injury   . Brain injury    Past Surgical History  Procedure Laterality Date  . Mandible surgery    . Abdominal hysterectomy    . Nasal sinus surgery    . Cholecystectomy     No family history on file. History  Substance Use Topics  . Smoking status: Never Smoker   . Smokeless tobacco: Not on file  . Alcohol Use: No   OB History   Grav Para Term Preterm Abortions TAB SAB Ect Mult Living                 Review of Systems  Neurological: Positive for weakness.  All other systems reviewed and are negative.    Allergies  Sulfa antibiotics; Contrast media; Gadolinium derivatives; Keflex; Penicillins; and  Phenergan  Home Medications   Current Outpatient Rx  Name  Route  Sig  Dispense  Refill  . amphetamine-dextroamphetamine (ADDERALL XR) 10 MG 24 hr capsule   Oral   Take 20-30 mg by mouth 2 (two) times daily. 3 caps in am; 2 caps in pm         . clarithromycin (BIAXIN XL) 500 MG 24 hr tablet   Oral   Take 1,000 mg by mouth daily. 10 day course of therapy         . clonazePAM (KLONOPIN) 0.5 MG tablet   Oral   Take 0.5-1 mg by mouth 2 (two) times daily as needed for anxiety.         . DULoxetine (CYMBALTA) 60 MG capsule   Oral   Take 1 capsule (60 mg total) by mouth daily.   10 capsule   0   . HYDROcodone-acetaminophen (NORCO/VICODIN) 5-325 MG per tablet   Oral   Take 1 tablet by mouth every 6 (six) hours as needed for pain.         Marland Kitchen levofloxacin (LEVAQUIN) 750 MG tablet   Oral   Take 1 tablet (750 mg total) by mouth daily. X 5 days   5 tablet   0   . levothyroxine (SYNTHROID, LEVOTHROID) 100 MCG tablet   Oral   Take 100 mcg by  mouth daily before breakfast.         . lisinopril (PRINIVIL,ZESTRIL) 20 MG tablet   Oral   Take 20 mg by mouth daily.         . naproxen sodium (ANAPROX) 220 MG tablet   Oral   Take 220 mg by mouth 2 (two) times daily as needed (for pain.).         Marland Kitchen. oxyCODONE-acetaminophen (PERCOCET/ROXICET) 5-325 MG per tablet   Oral   Take 2 tablets by mouth every 4 (four) hours as needed for pain.   20 tablet   0   . traMADol (ULTRAM) 50 MG tablet   Oral   Take 50 mg by mouth every 6 (six) hours as needed for pain.          BP 153/86  Pulse 106  Temp(Src) 97.7 F (36.5 C) (Oral)  Resp 16  Ht 5\' 3"  (1.6 m)  Wt 150 lb (68.04 kg)  BMI 26.58 kg/m2  SpO2 97% Physical Exam  Nursing note and vitals reviewed. Constitutional: She appears well-developed and well-nourished.  HENT:  Head: Normocephalic.  Eyes: Pupils are equal, round, and reactive to light.  Neck: Normal range of motion. Neck supple.  Cardiovascular: Normal rate  and normal heart sounds.   Pulmonary/Chest: Effort normal.  Abdominal: Soft.  Musculoskeletal: She exhibits tenderness.  Neurological: She is alert.  Skin: Skin is warm.  Psychiatric: She has a normal mood and affect.    ED Course  Procedures (including critical care time) Labs Review Labs Reviewed  CBC WITH DIFFERENTIAL  COMPREHENSIVE METABOLIC PANEL  URINALYSIS, ROUTINE W REFLEX MICROSCOPIC   Imaging Review Ct Abdomen Pelvis Wo Contrast  01/16/2013   CLINICAL DATA:  Right flank pain.  Hematuria.  EXAM: CT ABDOMEN AND PELVIS WITHOUT CONTRAST  TECHNIQUE: Multidetector CT imaging of the abdomen and pelvis was performed following the standard protocol without intravenous contrast.  COMPARISON:  None.  FINDINGS: Lung Bases: Normal.  Liver: Fatty liver. Unenhanced CT was performed per clinician order. Lack of IV contrast limits sensitivity and specificity, especially for evaluation of abdominal/pelvic solid viscera.  Spleen:  Normal.  Gallbladder:  Cholecystectomy.  Common bile duct:  Normal.  Pancreas:  Normal.  Adrenal glands:  Normal bilaterally.  Kidneys: Normal. No hydronephrosis or calculi. Both ureters appear within normal limits.  Stomach:  Grossly normal.  Small bowel:  Grossly normal.  Colon: Normal appendix. High position of the cecum. Normal appearance of the colon.  Pelvic Genitourinary: Hysterectomy. Urinary bladder appears normal. No free fluid.  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 Lumbar spondylosis with degenerative spondylolisthesis. Lumbar scoliosis.  When comparing today's appearance of the right hip to the prior exam of 10/13/2012, there has been marked interval progression. Destructive changes of the right femoral head are now severe, with collapse of the head and remodeling of the acetabulum. Debris and loose bodies are present within the joint. Atrophy of the right gluteus minimis is noted which may be secondary to disuse.  Vasculature: Atherosclerosis  without acute abnormality.  Body Wall: Normal.  IMPRESSION: 1. No acute intra-abdominal abnormality. 2. Fatty liver. 3. Cholecystectomy and hysterectomy. 4. Severe progressive destructive changes of the right hip. Although this may represent rapidly progressive AVN and secondary osteoarthritis, the appearance can also be associated with neuropathic joint or chronic septic arthritis.   Electronically Signed   By: Andreas NewportGeoffrey  Lamke M.D.   On: 01/16/2013 18:13    EKG Interpretation    Date/Time:  Wednesday January 18 2013 13:03:14 EST Ventricular Rate:  106 PR Interval:  158 QRS Duration: 74 QT Interval:  346 QTC Calculation: 459 R Axis:   50 Text Interpretation:  Sinus tachycardia Otherwise normal ECG No prior EKG Confirmed by Gwendolyn Grant  MD, BLAIR ([id number]) on 01/18/2013 1:06:08 PM            MDM   1. Hip pain, right   2. Weakness   3. UTI (lower urinary tract infection)   4. Dehydration    I spoke to [facility] for [facility] who will admit to [facility]   Lonia Skinner [place], [place] 01/18/13 [phone]

## 2013-01-18 NOTE — ED Notes (Signed)
Pt reports right hip pain "for sometime".  She was seen in ED recently, referred to ortho and had an appt at 11:00 today.  She states she was unable to get to the appt. And feels she needs to be admitted to the hospital or assisted living. SHe now report left chest wall pain that is described as severe.

## 2013-01-18 NOTE — ED Notes (Signed)
[facility] at bedside for transport. 

## 2013-01-18 NOTE — ED Notes (Signed)
Patient transported to X-ray 

## 2013-01-18 NOTE — ED Notes (Signed)
Karen Sofia, PA-C at bedside 

## 2013-01-18 NOTE — ED Notes (Addendum)
Called to patient's room by pt yelling her chest and back hurt.  She then reports she has dry heaves and right hip pain.  Pt appears anxious.  Langston MaskerKaren Sofia, PA-C informed.  Pt is unable to void.  IVF infusing.

## 2013-01-19 ENCOUNTER — Telehealth (HOSPITAL_BASED_OUTPATIENT_CLINIC_OR_DEPARTMENT_OTHER): Payer: Self-pay

## 2013-01-19 LAB — URINE CULTURE
Colony Count: NO GROWTH
Culture: NO GROWTH

## 2013-01-19 NOTE — ED Provider Notes (Signed)
Medical screening examination/treatment/procedure(s) were conducted as a shared visit with non-physician practitioner(s) and myself.  I personally evaluated the patient during the encounter.  EKG Interpretation    Date/Time:  Wednesday January 18 2013 13:03:14 EST Ventricular Rate:  106 PR Interval:  158 QRS Duration: 74 QT Interval:  346 QTC Calculation: 459 R Axis:   50 Text Interpretation:  Sinus tachycardia Otherwise normal ECG No prior EKG Confirmed by Gwendolyn GrantWALDEN  MD, Maddelyn Rocca ([id number]) on 01/18/2013 1:06:08 PM             Patient here with hip pain. 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 She is unable to ambulate and transfer at this time. No falls or trauma. No new fractures. Admitted to help with [facility]/placement at [facility].  Annette HaitWilliam Akiyah Eppolito, MD 01/19/13 [phone]

## 2013-01-19 NOTE — Telephone Encounter (Signed)
Post ED Visit - Positive Culture Follow-up  Culture report reviewed by antimicrobial stewardship pharmacist: [x]  Wes Dulaney, Pharm.D., BCPS []  Celedonio MiyamotoJeremy Frens, Pharm.D., BCPS []  Georgina PillionElizabeth Martin, [address] BCPS []  Dripping SpringsMinh Pham, [address]Ph[address]., BCPS, AAHIVP []  Estella HuskMichelle Turner, Pharm.D., BCPS, AAHIVP  Positive urine culture e.coli Treated with Levofloxacin, organism sensitive to the same and no further patient follow-up is required at this time.  Ashley JacobsFesterman, Levander Katzenstein C 01/19/2013, 11:18 AM

## 2013-08-16 ENCOUNTER — Encounter (HOSPITAL_BASED_OUTPATIENT_CLINIC_OR_DEPARTMENT_OTHER): Payer: Self-pay | Admitting: Emergency Medicine

## 2013-08-16 ENCOUNTER — Emergency Department (HOSPITAL_BASED_OUTPATIENT_CLINIC_OR_DEPARTMENT_OTHER)
Admission: EM | Admit: 2013-08-16 | Discharge: 2013-08-16 | Disposition: A | Discharge status: Home / Self Care | Payer: Medicare Other | Attending: Emergency Medicine | Admitting: Emergency Medicine

## 2013-08-16 ENCOUNTER — Emergency Department (HOSPITAL_BASED_OUTPATIENT_CLINIC_OR_DEPARTMENT_OTHER): Payer: Medicare Other

## 2013-08-16 DIAGNOSIS — R42 Dizziness and giddiness: Secondary | ICD-10-CM | POA: Insufficient documentation

## 2013-08-16 DIAGNOSIS — Z87820 Personal history of traumatic brain injury: Secondary | ICD-10-CM | POA: Diagnosis not present

## 2013-08-16 DIAGNOSIS — Z79899 Other long term (current) drug therapy: Secondary | ICD-10-CM | POA: Insufficient documentation

## 2013-08-16 DIAGNOSIS — F411 Generalized anxiety disorder: Secondary | ICD-10-CM | POA: Diagnosis not present

## 2013-08-16 DIAGNOSIS — E079 Disorder of thyroid, unspecified: Secondary | ICD-10-CM | POA: Insufficient documentation

## 2013-08-16 DIAGNOSIS — Z880 Allergy status to penicillin: Secondary | ICD-10-CM | POA: Insufficient documentation

## 2013-08-16 DIAGNOSIS — F4310 Post-traumatic stress disorder, unspecified: Secondary | ICD-10-CM | POA: Insufficient documentation

## 2013-08-16 DIAGNOSIS — G43909 Migraine, unspecified, not intractable, without status migrainosus: Secondary | ICD-10-CM | POA: Insufficient documentation

## 2013-08-16 DIAGNOSIS — I10 Essential (primary) hypertension: Secondary | ICD-10-CM | POA: Insufficient documentation

## 2013-08-16 DIAGNOSIS — Z792 Long term (current) use of antibiotics: Secondary | ICD-10-CM | POA: Diagnosis not present

## 2013-08-16 HISTORY — DX: Anxiety disorder, unspecified: F41.9

## 2013-08-16 HISTORY — DX: Post-traumatic stress disorder, unspecified: F43.10

## 2013-08-16 HISTORY — DX: Migraine, unspecified, not intractable, without status migrainosus: G43.909

## 2013-08-16 LAB — URINALYSIS, ROUTINE W REFLEX MICROSCOPIC
Bilirubin Urine: NEGATIVE
Glucose, UA: NEGATIVE mg/dL
Hgb urine dipstick: NEGATIVE
Ketones, ur: NEGATIVE mg/dL
LEUKOCYTES UA: NEGATIVE
Nitrite: NEGATIVE
PROTEIN: NEGATIVE mg/dL
Specific Gravity, Urine: 1.007 (ref 1.005–1.030)
UROBILINOGEN UA: 0.2 mg/dL (ref 0.0–1.0)
pH: 7 (ref 5.0–8.0)

## 2013-08-16 LAB — CBC WITH DIFFERENTIAL/PLATELET
BASOS ABS: 0 10*3/uL (ref 0.0–0.1)
BASOS PCT: 0 % (ref 0–1)
Eosinophils Absolute: 0.2 10*3/uL (ref 0.0–0.7)
Eosinophils Relative: 4 % (ref 0–5)
HEMATOCRIT: 38.2 % (ref 36.0–46.0)
HEMOGLOBIN: 13 g/dL (ref 12.0–15.0)
LYMPHS PCT: 33 % (ref 12–46)
Lymphs Abs: 1.8 10*3/uL (ref 0.7–4.0)
MCH: 30.2 pg (ref 26.0–34.0)
MCHC: 34 g/dL (ref 30.0–36.0)
MCV: 88.6 fL (ref 78.0–100.0)
MONO ABS: 0.5 10*3/uL (ref 0.1–1.0)
MONOS PCT: 10 % (ref 3–12)
NEUTROS ABS: 2.9 10*3/uL (ref 1.7–7.7)
NEUTROS PCT: 54 % (ref 43–77)
Platelets: 252 10*3/uL (ref 150–400)
RBC: 4.31 MIL/uL (ref 3.87–5.11)
RDW: 12.9 % (ref 11.5–15.5)
WBC: 5.5 10*3/uL (ref 4.0–10.5)

## 2013-08-16 LAB — BASIC METABOLIC PANEL
Anion gap: 14 (ref 5–15)
BUN: 18 mg/dL (ref 6–23)
CALCIUM: 9.9 mg/dL (ref 8.4–10.5)
CO2: 24 mEq/L (ref 19–32)
Chloride: 100 mEq/L (ref 96–112)
Creatinine, Ser: 0.8 mg/dL (ref 0.50–1.10)
GFR, EST NON AFRICAN AMERICAN: 78 mL/min — AB (ref >90)
GLUCOSE: 89 mg/dL (ref 70–99)
POTASSIUM: 4.5 meq/L (ref 3.7–5.3)
SODIUM: 138 meq/L (ref 137–147)

## 2013-08-16 NOTE — Discharge Instructions (Signed)

## 2013-08-16 NOTE — ED Notes (Signed)
Lightheaded and dizzy intermittently x 3 weeks.  Worse with positional changes. States she felt sweaty, severe dizziness and palpitations

## 2013-08-16 NOTE — ED Provider Notes (Signed)
Medical screening examination/treatment/procedure(s) were performed by non-physician practitioner and as supervising physician I was immediately available for consultation/collaboration.   EKG Interpretation   Date/Time:  Wednesday August 16 2013 18:06:08 EDT Ventricular Rate:  85 PR Interval:  144 QRS Duration: 72 QT Interval:  420 QTC Calculation: 499 R Axis:   43 Text Interpretation:  Normal sinus rhythm Nonspecific T wave abnormality  Prolonged QT Similar to prior Confirmed by Gwendolyn GrantWALDEN  MD, Emerald Gehres ([id number]) on  08/16/2013 6:12:52 PM        Elwin MochaBlair Torrie Lafavor, MD 08/16/13 [id number]

## 2013-08-16 NOTE — ED Provider Notes (Signed)
CSN: [id number]     Arrival date & time 08/16/13  1715 History   First MD Initiated Contact with Patient 08/16/13 1718     Chief Complaint  Patient presents with  . Dizziness     (Consider location/radiation/quality/duration/timing/severity/associated sxs/prior Treatment) HPI Comments: Pt comes in with c/o of intermittent dizziness for the last couple of weeks. Pt states that she was having high blood pressure so she increased her lisinopril over the last couple of days. Pt states that she feels near syncopal with position changes. No cp, sob, n/v/d. Pt denies fever and cough. States that she has headaches but nothing different from her baseline.  The history is provided by the patient. No language interpreter was used.    Past Medical History  Diagnosis Date  . Hypertension   . Thyroid disease   . Traumatic brain injury   . Brain injury   . Anxiety   . PTSD (post-traumatic stress disorder)   . Migraines    Past Surgical History  Procedure Laterality Date  . Mandible surgery    . Abdominal hysterectomy    . Nasal sinus surgery    . Cholecystectomy     No family history on file. History  Substance Use Topics  . Smoking status: Never Smoker   . Smokeless tobacco: Not on file  . Alcohol Use: Yes     Comment: rare   OB History   Grav Para Term Preterm Abortions TAB SAB Ect Mult Living                 Review of Systems  Constitutional: Negative.   Respiratory: Negative.   Cardiovascular: Positive for palpitations.  Neurological: Positive for light-headedness.      Allergies  Sulfa antibiotics; Albuterol; Contrast media; Gadolinium derivatives; Keflex; Penicillins; and Phenergan  Home Medications   Prior to Admission medications   Medication Sig Start Date End Date Taking? Authorizing Provider  TIZANIDINE HCL PO Take by mouth.   Yes Historical Provider, MD  amphetamine-dextroamphetamine (ADDERALL XR) 10 MG 24 hr capsule Take 20-30 mg by mouth 2 (two) times  daily. 3 caps in am; 2 caps in pm    Historical Provider, MD  clarithromycin (BIAXIN XL) 500 MG 24 hr tablet Take 1,000 mg by mouth daily. 10 day course of therapy    Historical Provider, MD  clonazePAM (KLONOPIN) 0.5 MG tablet Take 0.5-1 mg by mouth 2 (two) times daily as needed for anxiety.    Historical Provider, MD  DULoxetine (CYMBALTA) 60 MG capsule Take 1 capsule (60 mg total) by mouth daily. 02/25/12   Nehemiah Settle, MD  HYDROcodone-acetaminophen (NORCO/VICODIN) 5-325 MG per tablet Take 1 tablet by mouth every 6 (six) hours as needed for pain.    Historical Provider, MD  levofloxacin (LEVAQUIN) 750 MG tablet Take 1 tablet (750 mg total) by mouth daily. X 5 days 01/17/13   Audree Camel, MD  levothyroxine (SYNTHROID, LEVOTHROID) 100 MCG tablet Take 100 mcg by mouth daily before breakfast.    Historical Provider, MD  lisinopril (PRINIVIL,ZESTRIL) 20 MG tablet Take 20 mg by mouth daily.    Historical Provider, MD  naproxen sodium (ANAPROX) 220 MG tablet Take 220 mg by mouth 2 (two) times daily as needed (for pain.).    Historical Provider, MD  oxyCODONE-acetaminophen (PERCOCET/ROXICET) 5-325 MG per tablet Take 2 tablets by mouth every 4 (four) hours as needed for pain. 08/10/12   Elson Areas, PA-C  traMADol (ULTRAM) 50 MG tablet Take 50 mg  by mouth every 6 (six) hours as needed for pain.    Historical Provider, MD   BP 141/80  Pulse 90  Temp(Src) 98.2 F (36.8 C) (Oral)  Resp 18  SpO2 97% Physical Exam  Nursing note and vitals reviewed. Constitutional: She is oriented to person, place, and time. She appears well-developed and well-nourished.  HENT:  Head: Normocephalic and atraumatic.  Eyes: Conjunctivae and EOM are normal. Pupils are equal, round, and reactive to light.  Neck: Normal range of motion. Neck supple.  Cardiovascular: Normal rate and regular rhythm.   Pulmonary/Chest: Effort normal and breath sounds normal.  Abdominal: Soft. Bowel sounds are normal. There is  no tenderness.  Musculoskeletal: Normal range of motion.  Neurological: She is alert and oriented to person, place, and time. Coordination normal.  Skin: Skin is warm and dry.  Psychiatric: She has a normal mood and affect.    ED Course  Procedures (including critical care time) Labs Review Labs Reviewed  BASIC METABOLIC PANEL - Abnormal; Notable for the following:    GFR calc non Af Amer 78 (*)    All other components within normal limits  CBC WITH DIFFERENTIAL  URINALYSIS, ROUTINE W REFLEX MICROSCOPIC    Imaging Review Dg Chest 2 View  08/16/2013   CLINICAL DATA:  Dizziness  EXAM: CHEST  2 VIEW  COMPARISON:  01/18/2013  FINDINGS: Cardiomediastinal silhouette is stable. Mild lower thoracic dextroscoliosis. No acute infiltrate or pleural effusion. No pulmonary edema.  IMPRESSION: No active cardiopulmonary disease.   Electronically Signed   By: Natasha MeadLiviu  Pop M.D.   On: 08/16/2013 18:52     EKG Interpretation   Date/Time:  Wednesday August 16 2013 18:06:08 EDT Ventricular Rate:  85 PR Interval:  144 QRS Duration: 72 QT Interval:  420 QTC Calculation: 499 R Axis:   43 Text Interpretation:  Normal sinus rhythm Nonspecific T wave abnormality  Prolonged QT Similar to prior Confirmed by Gwendolyn GrantWALDEN  MD, BLAIR ([id number]) on  08/16/2013 6:12:52 PM      MDM   Final diagnoses:  Dizziness    Pt electrolytes normal. Pt is ambulatory without any problem. No changes in ekg.pt has pcp to follow up with    Teressa LowerVrinda Shatoria [place], NP 08/16/13 1929

## 2013-08-16 NOTE — ED Notes (Signed)
Patient transported to X-ray 

## 2015-02-25 ENCOUNTER — Ambulatory Visit: Payer: Medicare Other | Admitting: Podiatry

## 2015-03-18 ENCOUNTER — Ambulatory Visit: Payer: Medicare Other | Admitting: Podiatry
